# Patient Record
Sex: Female | Born: 2000 | Race: White | Hispanic: No | Marital: Single | Smoking: Never smoker
Health system: Southern US, Community
[De-identification: ages and names within clinical notes are randomized; demographics above are authoritative.]

## PROBLEM LIST (undated history)

## (undated) DIAGNOSIS — K219 Gastro-esophageal reflux disease without esophagitis: Secondary | ICD-10-CM

## (undated) DIAGNOSIS — K5900 Constipation, unspecified: Secondary | ICD-10-CM

## (undated) HISTORY — DX: Gastro-esophageal reflux disease without esophagitis: K21.9

## (undated) HISTORY — PX: EYE SURGERY: SHX253

## (undated) HISTORY — DX: Constipation, unspecified: K59.00

---
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
## 2022-01-13 ENCOUNTER — Ambulatory Visit (INDEPENDENT_AMBULATORY_CARE_PROVIDER_SITE_OTHER): Payer: 59 | Admitting: Physician Assistant

## 2022-01-13 ENCOUNTER — Encounter: Payer: Self-pay | Admitting: Physician Assistant

## 2022-01-13 DIAGNOSIS — G4700 Insomnia, unspecified: Secondary | ICD-10-CM | POA: Diagnosis not present

## 2022-01-13 DIAGNOSIS — F411 Generalized anxiety disorder: Secondary | ICD-10-CM

## 2022-01-13 DIAGNOSIS — F340 Cyclothymic disorder: Secondary | ICD-10-CM | POA: Diagnosis not present

## 2022-01-13 MED ORDER — TEMAZEPAM 22.5 MG PO CAPS: ORAL_CAPSULE | ORAL | 1 refills | Status: DC

## 2022-01-13 MED ORDER — CLORAZEPATE DIPOTASSIUM 3.75 MG PO TABS: 3.7500 mg | ORAL_TABLET | Freq: Two times a day (BID) | ORAL | 1 refills | Status: DC | PRN

## 2022-01-13 MED ORDER — LAMOTRIGINE 150 MG PO TABS: 150.0000 mg | ORAL_TABLET | Freq: Every day | ORAL | 1 refills | Status: DC

## 2022-01-13 NOTE — Progress Notes (Signed)
[facility]  Patient ID: Caroline Bryan,  MRN: [id number]  PCP: [name], MD (Inactive)  Date of Evaluation: 01/13/2022 time spent:20 minutes  Chief Complaint:  Chief Complaint   Anxiety; Depression; Follow-up; Insomnia    We increased the Lamictal and Trileptal 5 weeks ago.  He is doing much better.  Is able to handle stressors better than she did. Patient is able to enjoy things.  Energy and motivation are good.  School is going well.   No extreme sadness, tearfulness, or feelings of hopelessness.  Sleeps well but she does have to take Restoril sometimes. ADLs and personal hygiene are normal.   Denies any changes in concentration, making decisions, or remembering things.  Appetite has not changed.  Weight is stable.  Denies laxative use, calorie restricting, or binging and purging.   Denies cutting or any form of self-harm. Not isolating.  Denies suicidal or homicidal thoughts.  She does have anxiety at times, requiring temazepam.  Does not needed as much as she used to.  It is still effective.  She will occasionally take the hydroxyzine and chooses to do that prior to taking the temazepam.  She is not really having panic attacks at all now it is more of a sense of unease and being overwhelmed.  Patient denies increased energy with decreased need for sleep, increased talkativeness, racing thoughts, impulsivity or risky behaviors, increased spending, increased libido, grandiosity, increased irritability or anger, paranoia, or hallucinations.  Denies dizziness, syncope, seizures, numbness, tingling, tremor, tics, unsteady gait, slurred speech, confusion. Denies muscle or joint pain, stiffness, or dystonia. Denies unexplained weight loss, frequent infections, or sores that heal slowly.  No polyphagia, polydipsia, or polyuria. Denies visual changes or paresthesias.    Individual Medical History/ Review of Systems: Changes? :No   Past medications for mental health diagnoses  include: Wellbutrin, Ambien, Cymbalta caused mania w/ psychosis, Tranxene, Restoril, Lamictal, Trileptal   Allergies: Patient has no known allergies.  Current Medications:  Current Outpatient Medications:    b complex vitamins capsule, Take 1 capsule by mouth daily., Disp: , Rfl:    hydrOXYzine (ATARAX/VISTARIL) 25 MG tablet, TAKE 1 TABLET BY MOUTH 3 TIMES DAILY AS NEEDED FOR ANXIETY OR ITCHING (URTICARIA)., Disp: 270 tablet, Rfl: 1   lamoTRIgine (LAMICTAL) 150 MG tablet, Take 1 tablet (150 mg total) by mouth daily., Disp: 90 tablet, Rfl: 1   oxcarbazepine (TRILEPTAL) 600 MG tablet, TAKE 1 TABLET BY MOUTH TWICE A DAY, Disp: 180 tablet, Rfl: 0   VITAMIN D, ERGOCALCIFEROL, PO, Take by mouth., Disp: , Rfl:    clorazepate (TRANXENE) 3.75 MG tablet, Take 1 tablet (3.75 mg total) by mouth 2 (two) times daily as needed for anxiety (Panic)., Disp: 60 tablet, Rfl: 1   temazepam (RESTORIL) 22.5 MG capsule, TAKE 1 CAPSULE BY MOUTH AT BEDITME AS NEEDED FOR SLEEP OR ANXIETY, Disp: 30 capsule, Rfl: 1 Medication Side Effects: none  Family Medical/ Social History: Changes?  GM died in 05-27-2022. Was expected, she had pancreatic cancer. Her uncle died in 26-Sep-2022 unexpected from OD. Then an old friend from HS died not long after that.   MENTAL HEALTH EXAM:  There were no vitals taken for this visit.There is no height or weight on file to calculate BMI.  General Appearance: Casual and Well Groomed  Eye Contact:  Good  Speech:  Clear and Coherent and Normal Rate  Volume:  Normal  Mood:  Euthymic  Affect:  Appropriate  Thought Process:  Goal Directed and Descriptions of Associations:  Circumstantial  Orientation:  Full (Time, Place, and Person)  Thought Content: Logical   Suicidal Thoughts:  No  Homicidal Thoughts:  No  Memory:  WNL  Judgement:  Good  Insight:  Good  Psychomotor Activity:  Normal  Concentration:  Concentration: Good and Attention Span: Good  Recall:  Good  Fund of Knowledge: Good   Language: Good  Assets:  Desire for Improvement Financial Resources/Insurance Housing Transportation Vocational/Educational  ADL's:  Intact  Cognition: WNL  Prognosis:  Good   DIAGNOSES:    ICD-10-CM   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 Generalized anxiety disorder  F41.1 temazepam (RESTORIL) 22.5 MG capsule    clorazepate (TRANXENE) 3.75 MG tablet    3. Insomnia, unspecified type  G47.00      Receiving Psychotherapy: No   RECOMMENDATIONS:  PDMP was reviewed.  Last temazepam 11/20/2021.  I provided 20 minutes of face to face time during this encounter, including time spent before and after the visit in records review, medical decision making, counseling pertinent to today's visit, and charting.   Glad to see her doing better.  No changes will be made. Sleep hygiene discussed.  Continue Tranxene 3.75 mg, 1 p.o. twice daily as needed panic.  Take as little as possible. Continue hydroxyzine 25 mg, 1 p.o. 3 times daily as needed. Continue Lamictal 150 mg daily. Continue Trileptal 600 mg, 1 p.o. twice daily. Continue Restoril 22.5 mg, 1 p.o. nightly as needed sleep. Recommend B complex and vitamin D daily. Recommend therapy. Return in 4 months.  Melony Overly, PA-C

## 2022-04-03 ENCOUNTER — Other Ambulatory Visit: Payer: Self-pay | Admitting: Physician Assistant

## 2022-04-03 DIAGNOSIS — F411 Generalized anxiety disorder: Secondary | ICD-10-CM

## 2022-04-06 ENCOUNTER — Other Ambulatory Visit: Payer: Self-pay | Admitting: Physician Assistant

## 2022-05-03 ENCOUNTER — Ambulatory Visit (INDEPENDENT_AMBULATORY_CARE_PROVIDER_SITE_OTHER): Payer: Self-pay | Admitting: Physician Assistant

## 2022-05-03 DIAGNOSIS — Z91199 Patient's noncompliance with other medical treatment and regimen due to unspecified reason: Secondary | ICD-10-CM

## 2022-05-03 NOTE — Progress Notes (Signed)
No show for appt. 

## 2022-06-15 ENCOUNTER — Other Ambulatory Visit: Payer: Self-pay | Admitting: Physician Assistant

## 2022-07-11 ENCOUNTER — Other Ambulatory Visit: Payer: Self-pay | Admitting: Physician Assistant

## 2022-07-11 NOTE — Telephone Encounter (Signed)
Please call to schedule an appt, was a no show in March  

## 2022-07-12 NOTE — Telephone Encounter (Signed)
Lvm for patient to call and schedule 

## 2022-07-23 ENCOUNTER — Other Ambulatory Visit: Payer: Self-pay | Admitting: Physician Assistant

## 2022-07-28 ENCOUNTER — Ambulatory Visit (INDEPENDENT_AMBULATORY_CARE_PROVIDER_SITE_OTHER): Payer: 59 | Admitting: Physician Assistant

## 2022-07-28 ENCOUNTER — Encounter: Payer: Self-pay | Admitting: Physician Assistant

## 2022-07-28 DIAGNOSIS — F411 Generalized anxiety disorder: Secondary | ICD-10-CM | POA: Diagnosis not present

## 2022-07-28 DIAGNOSIS — G4700 Insomnia, unspecified: Secondary | ICD-10-CM | POA: Diagnosis not present

## 2022-07-28 DIAGNOSIS — F340 Cyclothymic disorder: Secondary | ICD-10-CM | POA: Diagnosis not present

## 2022-07-28 MED ORDER — OXCARBAZEPINE 600 MG PO TABS: 600.0000 mg | ORAL_TABLET | Freq: Two times a day (BID) | ORAL | 1 refills | Status: DC

## 2022-07-28 MED ORDER — ALPRAZOLAM 0.5 MG PO TABS: 0.2500 mg | ORAL_TABLET | Freq: Two times a day (BID) | ORAL | 0 refills | Status: DC | PRN

## 2022-07-28 MED ORDER — TEMAZEPAM 22.5 MG PO CAPS
22.5000 mg | ORAL_CAPSULE | Freq: Every evening | ORAL | 3 refills | Status: DC | PRN
Start: 2022-07-28 — End: 2022-08-02

## 2022-07-28 MED ORDER — LAMOTRIGINE 150 MG PO TABS: 150.0000 mg | ORAL_TABLET | Freq: Every day | ORAL | 1 refills | Status: DC

## 2022-07-28 NOTE — Progress Notes (Signed)
[facility]  Patient ID: Caroline Bryan,  MRN: [id number]  PCP: Caroline Purl, MD (Inactive)  Date of Evaluation: 07/28/2022 Time spent:30 minutes  Chief Complaint:  Chief Complaint   Anxiety; Depression; Insomnia; Follow-up    Has been under more stress. See Social Hx. Feels that the Tranxene doesn't help like it used to. She still gets 'stress hives' and uses hydroxyzine for that. It is effective for that but makes her drowsy. Starts to get panicky sometimes and the Tranxene used to help but not anymore. Doesn't use it often.   Patient is able to enjoy things.  Energy and motivation are good. Not working right now, plans to when she moves back to [place] later this summer.   No extreme sadness, tearfulness, or feelings of hopelessness.  Sleeps well but sometimes takes the restoril which helps. ADLs and personal hygiene are normal.   Denies any changes in concentration, making decisions, or remembering things.  Appetite has not changed.  Weight is stable.  Denies laxative use, calorie restricting, or binging and purging.   Denies cutting or any form of self-harm.  Denies suicidal or homicidal thoughts.  Patient denies increased energy with decreased need for sleep, increased talkativeness, racing thoughts, impulsivity or risky behaviors, increased spending, increased libido, grandiosity, increased irritability or anger, paranoia, or hallucinations.  Denies dizziness, syncope, seizures, numbness, tingling, tremor, tics, unsteady gait, slurred speech, confusion. Denies muscle or joint pain, stiffness, or dystonia. Denies unexplained weight loss, frequent infections, or sores that heal slowly.  No polyphagia, polydipsia, or polyuria. Denies visual changes or paresthesias.    Individual Medical History/ Review of Systems: Changes? :No   Past medications for mental health diagnoses include: Wellbutrin, Ambien, Cymbalta caused mania w/ psychosis, Tranxene, Restoril, Lamictal, Trileptal    Allergies: Patient has no known allergies.  Current Medications:  Current Outpatient Medications:    ALPRAZolam (XANAX) 0.5 MG tablet, Take 0.5-1 tablets (0.25-0.5 mg total) by mouth 2 (two) times daily as needed for anxiety., Disp: 60 tablet, Rfl: 0   b complex vitamins capsule, Take 1 capsule by mouth daily., Disp: , Rfl:    ferrous sulfate 325 (65 FE) MG EC tablet, Take 325 mg by mouth daily., Disp: , Rfl:    hydrOXYzine (ATARAX/VISTARIL) 25 MG tablet, TAKE 1 TABLET BY MOUTH 3 TIMES DAILY AS NEEDED FOR ANXIETY OR ITCHING (URTICARIA)., Disp: 270 tablet, Rfl: 1   VITAMIN D, ERGOCALCIFEROL, PO, Take by mouth., Disp: , Rfl:    lamoTRIgine (LAMICTAL) 150 MG tablet, Take 1 tablet (150 mg total) by mouth daily., Disp: 90 tablet, Rfl: 1   oxcarbazepine (TRILEPTAL) 600 MG tablet, Take 1 tablet (600 mg total) by mouth 2 (two) times daily., Disp: 180 tablet, Rfl: 1 Medication Side Effects: none  Family Medical/ Social History: Changes?  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 That's been stressful. Had to take Incomplete in some of her classes b/c of the stress.   MENTAL HEALTH EXAM:  There were no vitals taken for this visit.There is no height or weight on file to calculate BMI.  General Appearance: Casual and Well Groomed  Eye Contact:  Good  Speech:  Clear and Coherent and Normal Rate  Volume:  Normal  Mood:  Euthymic  Affect:  Appropriate  Thought Process:  Goal Directed and Descriptions of Associations: Circumstantial  Orientation:  Full (Time, Place, and Person)  Thought Content: Logical   Suicidal Thoughts:  No  Homicidal Thoughts:  No  Memory:  WNL  Judgement:  Good  Insight:  Good  Psychomotor Activity:  Normal  Concentration:  Concentration: Good and Attention Span: Good  Recall:  Good  Fund of Knowledge: Good  Language: Good  Assets:  Communication Skills Desire for Improvement Financial Resources/Insurance Housing Transportation Vocational/Educational  ADL's:  Intact   Cognition: WNL  Prognosis:  Good   DIAGNOSES:    ICD-10-CM   1. 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 Insomnia, unspecified type  G47.00      Receiving Psychotherapy: No   RECOMMENDATIONS:  PDMP was reviewed.  Last temazepam 06/14/2022. I provided 30 minutes of face to face time during this encounter, including time spent before and after the visit in records review, medical decision making, counseling pertinent to today's visit, and charting.   We discussed the benzodiazepine use.  I would like to get her completely off due to the addictive potential.  However she has been on them for several years off and on, does not take very often, and hydroxyzine causes too much drowsiness to take that for rescue when she gets super anxious.  For now we will continue their use but we will change the Tranxene to Xanax. We discussed the short-term and long-term risks of benzodiazepines including sedation, increased risk of falling, dizziness, tolerance, and addictive potential.  Patient understands and accepts.  Sleep hygiene discussed at length.  Discontinue Tranxene. Start Xanax 0.5 mg, 1/2-1 twice daily as needed anxiety.  Take as little as possible. Continue hydroxyzine 25 mg, 1 p.o. 3 times daily as needed. Continue Lamictal 150 mg daily. Continue Trileptal 600 mg, 1 p.o. twice daily. Continue Restoril 22.5 mg, 1 p.o. nightly as needed sleep. Take as sparingly as possible.  Recommend B complex and vitamin D daily. Recommend therapy. Return in 6 months.  Melony Overly, PA-C

## 2023-01-16 ENCOUNTER — Other Ambulatory Visit: Payer: Self-pay | Admitting: Physician Assistant

## 2023-01-16 DIAGNOSIS — F411 Generalized anxiety disorder: Secondary | ICD-10-CM

## 2023-01-19 ENCOUNTER — Other Ambulatory Visit: Payer: Self-pay | Admitting: Physician Assistant

## 2023-01-19 DIAGNOSIS — F411 Generalized anxiety disorder: Secondary | ICD-10-CM

## 2023-01-24 ENCOUNTER — Other Ambulatory Visit: Payer: Self-pay | Admitting: Physician Assistant

## 2023-01-24 DIAGNOSIS — F411 Generalized anxiety disorder: Secondary | ICD-10-CM

## 2023-01-25 NOTE — Telephone Encounter (Signed)
LF 10/9  6/5 note:  Continue Restoril 22.5 mg, 1 p.o. nightly as needed sleep. Take as sparingly as possible.  (Note on pharmacy RF said it was discontinued)  Has been filling. Pending for your review.   12/01/2022 07/28/2022 1  Temazepam 22.5 Mg Capsule 30.00 30 [place] [id number] [place] (0391) 3/3 1.13 [facility] Comm [facility] [place] 10/20/2022 07/28/2022 1  Temazepam 22.5 Mg Capsule 30.00 30 [place] [id number] Nor (0391) 2/3 1.13 L[facility] Comm Ins [place] 09/13/2022 07/28/2022 1  Temazepam 22.5 Mg Capsule 30.00 30 [place] [id number] Nor (0391) 1/3 1.13 LME Comm Ins [place] 07/28/2022 07/28/2022 1  Alprazolam 0.5 Mg Tablet 60.00 30 [place] [id number] [place] (0391) 0/0 2.00 LME Comm Ins [place] 07/28/2022 07/28/2022 1  Temazepam 22.5 Mg Capsule 30.00 30

## 2023-02-09 ENCOUNTER — Ambulatory Visit (INDEPENDENT_AMBULATORY_CARE_PROVIDER_SITE_OTHER): Payer: 59 | Admitting: Physician Assistant

## 2023-02-09 ENCOUNTER — Encounter: Payer: Self-pay | Admitting: Physician Assistant

## 2023-02-09 DIAGNOSIS — Z79899 Other long term (current) drug therapy: Secondary | ICD-10-CM

## 2023-02-09 DIAGNOSIS — F319 Bipolar disorder, unspecified: Secondary | ICD-10-CM

## 2023-02-09 DIAGNOSIS — F411 Generalized anxiety disorder: Secondary | ICD-10-CM | POA: Diagnosis not present

## 2023-02-09 DIAGNOSIS — L282 Other prurigo: Secondary | ICD-10-CM | POA: Diagnosis not present

## 2023-02-09 MED ORDER — ALPRAZOLAM 0.5 MG PO TABS: 0.2500 mg | ORAL_TABLET | Freq: Two times a day (BID) | ORAL | 0 refills | Status: DC | PRN

## 2023-02-09 MED ORDER — TEMAZEPAM 22.5 MG PO CAPS: 22.5000 mg | ORAL_CAPSULE | Freq: Every evening | ORAL | 5 refills | Status: DC | PRN

## 2023-02-09 MED ORDER — HYDROXYZINE HCL 25 MG PO TABS: 25.0000 mg | ORAL_TABLET | Freq: Three times a day (TID) | ORAL | 1 refills | Status: DC | PRN

## 2023-02-09 MED ORDER — LAMOTRIGINE 150 MG PO TABS: 150.0000 mg | ORAL_TABLET | Freq: Every day | ORAL | 1 refills | Status: DC

## 2023-02-09 MED ORDER — OXCARBAZEPINE 600 MG PO TABS: 600.0000 mg | ORAL_TABLET | Freq: Three times a day (TID) | ORAL | 1 refills | Status: DC

## 2023-02-09 NOTE — Progress Notes (Signed)
[facility]  Patient ID: Caroline Bryan,  MRN: [id number]  PCP: [name], MD (Inactive)  Date of Evaluation: 02/09/2023 Time spent:35 minutes  Chief Complaint:  Chief Complaint   Anxiety; Depression; Insomnia; Follow-up    Has been under a lot of stress.  At the beginning of the semester, she was really anxious, unable to eat much, fainted, had to go to the ER. Better with that now. Has felt 'manicky' for a few months. Spends more, had to withdraw from a few college classes because she didn't have the 'mental capacity to do anything,' not sleeping well but wasn't able to get Restoril, which she must have or she can't sleep. Had a friend keep her credit/debit cards b/c she was spending impulsively, no concept of consequences for awhile, 'wreckless' sexual behaviors, paranoia in that she checks things over and over to make sure she locked the doors for example. Also more irritable, gets angry easily.  Patient denies increased talkativeness, racing thoughts, grandiosity, or hallucinations.  Patient is able to enjoy things.  Energy and motivation are good.  Work is going well.  She works 2 jobs, lives in [place] where she goes to college.  No extreme sadness, tearfulness, or feelings of hopelessness.  ADLs and personal hygiene are normal.   Denies any changes in concentration, making decisions, or remembering things.  Appetite has not changed.  Weight is stable.  Denies laxative use, calorie restricting, or binging and purging.   Denies cutting or any form of self-harm.  Anxiety is controlled, she rarely takes Xanax.  Denies suicidal or homicidal thoughts.  Denies dizziness, syncope, seizures, numbness, tingling, tremor, tics, unsteady gait, slurred speech, confusion. Denies muscle or joint pain, stiffness, or dystonia. Denies unexplained weight loss, frequent infections, or sores that heal slowly.  No polyphagia, polydipsia, or polyuria. Denies visual changes or paresthesias.     Individual Medical History/ Review of Systems: Changes? :No   Past medications for mental health diagnoses include: Wellbutrin, Ambien, Cymbalta caused mania w/ psychosis, Tranxene, Restoril, Lamictal, Trileptal   Allergies: Patient has no known allergies.  Current Medications:  Current Outpatient Medications:    b complex vitamins capsule, Take 1 capsule by mouth daily., Disp: , Rfl:    ferrous sulfate 325 (65 FE) MG EC tablet, Take 325 mg by mouth daily., Disp: , Rfl:    VITAMIN D, ERGOCALCIFEROL, PO, Take by mouth., Disp: , Rfl:    ALPRAZolam (XANAX) 0.5 MG tablet, Take 0.5-1 tablets (0.25-0.5 mg total) by mouth 2 (two) times daily as needed for anxiety., Disp: 60 tablet, Rfl: 0   hydrOXYzine (ATARAX) 25 MG tablet, Take 1 tablet (25 mg total) by mouth every 8 (eight) hours as needed., Disp: 270 tablet, Rfl: 1   lamoTRIgine (LAMICTAL) 150 MG tablet, Take 1 tablet (150 mg total) by mouth daily., Disp: 90 tablet, Rfl: 1   oxcarbazepine (TRILEPTAL) 600 MG tablet, Take 1 tablet (600 mg total) by mouth 3 (three) times daily., Disp: 270 tablet, Rfl: 1   temazepam (RESTORIL) 22.5 MG capsule, Take 1 capsule (22.5 mg total) by mouth at bedtime as needed., Disp: 30 capsule, Rfl: 5 Medication Side Effects: none  Family Medical/ Social History: Changes?  no  MENTAL HEALTH EXAM:  There were no vitals taken for this visit.There is no height or weight on file to calculate BMI.  General Appearance: Casual and Well Groomed  Eye Contact:  Good  Speech:  Clear and Coherent and Normal Rate  Volume:  Normal  Mood:  Anxious  Affect:  Appropriate  Thought Process:  Goal Directed and Descriptions of Associations: Circumstantial  Orientation:  Full (Time, Place, and Person)  Thought Content: Logical   Suicidal Thoughts:  No  Homicidal Thoughts:  No  Memory:  WNL  Judgement:  Good  Insight:  Good  Psychomotor Activity:  Normal  Concentration:  Concentration: Good and Attention Span: Good  Recall:   Good  Fund of Knowledge: Good  Language: Good  Assets:  Communication Skills Desire for Improvement Financial Resources/Insurance Housing Transportation Vocational/Educational  ADL's:  Intact  Cognition: WNL  Prognosis:  Good   DIAGNOSES:    ICD-10-CM   1. Encounter for long-term (current) use of medications  Z79.899 Lamotrigine level    10-Hydroxycarbazepine    10-Hydroxycarbazepine    Lamotrigine level    2. Generalized anxiety disorder  F41.1 hydrOXYzine (ATARAX) 25 MG tablet    3. 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 Bipolar I disorder (HCC)  F31.9 Lamotrigine level    10-Hydroxycarbazepine    10-Hydroxycarbazepine    Lamotrigine level      Receiving Psychotherapy: No   RECOMMENDATIONS:  PDMP was reviewed.  Last temazepam 12/01/2022.   I provided 35 minutes of face to face time during this encounter, including time spent before and after the visit in records review, medical decision making, counseling pertinent to today's visit, and charting.   Discussed the mania. She's been stable on the Trileptal for awhile, I recommend increasing the dose. Benefits, risks, SE discussed and she accepts.   Continue Xanax 0.5 mg, 1/2-1 twice daily as needed anxiety.  Take as little as possible. Continue hydroxyzine 25 mg, 1 p.o. 3 times daily as needed. Continue Lamictal 150 mg daily. Increase to Trileptal 600 m g, 1 p.o. tid. Continue Restoril 22.5 mg, 1 p.o. nightly as needed sleep. Take as sparingly as possible.  Continue B complex and vitamin D daily. Labs ordered as above. Recommend therapy. Return in 6 weeks.  Melony Overly, PA-C

## 2023-02-10 ENCOUNTER — Telehealth: Payer: Self-pay | Admitting: Physician Assistant

## 2023-02-10 NOTE — Telephone Encounter (Signed)
Pt was seen yesterday and needs a PA for her temazepam 22.5 She would like a call back when it is approved [phone]

## 2023-02-26 LAB — LAMOTRIGINE LEVEL: Lamotrigine Lvl: 2.5 ug/mL (ref 2.0–20.0)

## 2023-02-26 LAB — 10-HYDROXYCARBAZEPINE: Oxcarbazepine SerPl-Mcnc: 29 ug/mL (ref 10–35)

## 2023-03-01 ENCOUNTER — Telehealth: Payer: Self-pay

## 2023-03-01 NOTE — Telephone Encounter (Signed)
 Prior Authorization initiated for Temazepam 22.5 mg with [facility],

## 2023-03-01 NOTE — Telephone Encounter (Signed)
 See phone message with approval

## 2023-03-01 NOTE — Telephone Encounter (Signed)
 Prior Approval received effective 01/30/2023-02/29/2024, PA# [id number] for Temazepam 22.5 mg with [facility]

## 2023-03-10 NOTE — Progress Notes (Signed)
Left VM on mobile to RC.

## 2023-03-29 ENCOUNTER — Other Ambulatory Visit: Payer: Self-pay | Admitting: Physician Assistant

## 2023-03-30 ENCOUNTER — Telehealth (INDEPENDENT_AMBULATORY_CARE_PROVIDER_SITE_OTHER): Payer: 59 | Admitting: Physician Assistant

## 2023-03-30 ENCOUNTER — Encounter: Payer: Self-pay | Admitting: Physician Assistant

## 2023-03-30 DIAGNOSIS — F411 Generalized anxiety disorder: Secondary | ICD-10-CM | POA: Diagnosis not present

## 2023-03-30 DIAGNOSIS — F319 Bipolar disorder, unspecified: Secondary | ICD-10-CM | POA: Diagnosis not present

## 2023-03-30 DIAGNOSIS — G4700 Insomnia, unspecified: Secondary | ICD-10-CM

## 2023-03-30 NOTE — Progress Notes (Signed)
 [facility]  Patient ID: Caroline Bryan,  MRN: [id number]  PCP: Hosey Dunnings, MD (Inactive)  Date of Evaluation: 03/30/2023 Time spent:20 minutes Virtual Visit via Telehealth  I connected with patient by a video enabled telemedicine application with their informed consent, and verified patient privacy and that I am speaking with the correct person using two identifiers.  I am private, in my office and the patient is at home.  I discussed the limitations, risks, security and privacy concerns of performing an evaluation and management service by video and the availability of in person appointments. I also discussed with the patient that there may be a patient responsible charge related to this service. The patient expressed understanding and agreed to proceed.   I discussed the assessment and treatment plan with the patient. The patient was provided an opportunity to ask questions and all were answered. The patient agreed with the plan and demonstrated an understanding of the instructions.   The patient was advised to call back or seek an in-person evaluation if the symptoms worsen or if the condition fails to improve as anticipated.  I provided 20  minutes of non-face-to-face time during this encounter.  Chief Complaint:  Chief Complaint   Anxiety; Depression; Follow-up    States she is doing well as far as her mental health is concerned.  She has the flu right now.  She is not feeling well from that but otherwise she is okay.  Patient is able to enjoy things.  Energy and motivation are good.  She is very busy at school.  It is going well, grades are good.  No extreme sadness, tearfulness, or feelings of hopelessness.  Sleeps well most of the time.  She does require Restoril  a lot of the time.  If she does not take it she has trouble falling asleep and staying asleep.  ADLs and personal hygiene are normal.  States that attention is good without easy distractibility.  Able to  focus on things and finish tasks to completion.  Appetite is normal and weight is stable.  No calorie restricting, laxative use, or binging or purging.  Anxiety is controlled, she rarely takes the Xanax .  Denies suicidal or homicidal thoughts.  Patient denies increased energy with decreased need for sleep, increased talkativeness, racing thoughts, impulsivity or risky behaviors, increased spending, increased libido, grandiosity, increased irritability or anger, paranoia, or hallucinations.  Denies dizziness, syncope, seizures, numbness, tingling, tremor, tics, unsteady gait, slurred speech, confusion. Denies muscle or joint pain, stiffness, or dystonia. Denies unexplained weight loss, frequent infections, or sores that heal slowly.  No polyphagia, polydipsia, or polyuria. Denies visual changes or paresthesias.    Individual Medical History/ Review of Systems: Changes? :Yes  has the flu  Past medications for mental health diagnoses include: Wellbutrin , Ambien , Cymbalta  caused mania w/ psychosis, Tranxene , Restoril , Lamictal , Trileptal    Allergies: Patient has no known allergies.  Current Medications:  Current Outpatient Medications:    ALPRAZolam  (XANAX ) 0.5 MG tablet, Take 0.5-1 tablets (0.25-0.5 mg total) by mouth 2 (two) times daily as needed for anxiety., Disp: 60 tablet, Rfl: 0   b complex vitamins capsule, Take 1 capsule by mouth daily., Disp: , Rfl:    ferrous sulfate 325 (65 FE) MG EC tablet, Take 325 mg by mouth daily., Disp: , Rfl:    hydrOXYzine  (ATARAX ) 25 MG tablet, Take 1 tablet (25 mg total) by mouth every 8 (eight) hours as needed., Disp: 270 tablet, Rfl: 1   lamoTRIgine  (LAMICTAL ) 150 MG tablet,  Take 1 tablet (150 mg total) by mouth daily., Disp: 90 tablet, Rfl: 1   temazepam  (RESTORIL ) 22.5 MG capsule, Take 1 capsule (22.5 mg total) by mouth at bedtime as needed., Disp: 30 capsule, Rfl: 5   VITAMIN D, ERGOCALCIFEROL, PO, Take by mouth., Disp: , Rfl:    oxcarbazepine  (TRILEPTAL )  600 MG tablet, Take 1 tablet (600 mg total) by mouth 3 (three) times daily., Disp: 270 tablet, Rfl: 1 Medication Side Effects: none  Family Medical/ Social History: Changes?  no  MENTAL HEALTH EXAM:  There were no vitals taken for this visit.There is no height or weight on file to calculate BMI.  General Appearance: Casual and Well Groomed  Eye Contact:  Good  Speech:  Clear and Coherent and Normal Rate  Volume:  Normal  Mood:  Euthymic  Affect:  Appropriate  Thought Process:  Goal Directed and Descriptions of Associations: Circumstantial  Orientation:  Full (Time, Place, and Person)  Thought Content: Logical   Suicidal Thoughts:  No  Homicidal Thoughts:  No  Memory:  WNL  Judgement:  Good  Insight:  Good  Psychomotor Activity:  Normal  Concentration:  Concentration: Good and Attention Span: Good  Recall:  Good  Fund of Knowledge: Good  Language: Good  Assets:  Communication Skills Desire for Improvement Financial Resources/Insurance Housing Transportation Vocational/Educational  ADL's:  Intact  Cognition: WNL  Prognosis:  Good   DIAGNOSES:    ICD-10-CM   1. Bipolar I disorder (HCC)  F31.9     2. Generalized anxiety disorder  F41.1     3. Insomnia, unspecified type  G47.00       Receiving Psychotherapy: No   RECOMMENDATIONS:  PDMP was reviewed.  Last temazepam  02/24/2023.  Xanax  filled 02/09/2024. I provided 20 minutes of non-face-to-face time during this encounter, including time spent before and after the visit in records review, medical decision making, counseling pertinent to today's visit, and charting.   She is doing well as far as her mental health goes so no changes will be made.  I hope she recovers from the flu soon.  Continue Xanax  0.5 mg, 1/2-1 twice daily as needed anxiety.  Take as little as possible. Continue hydroxyzine  25 mg, 1 p.o. 3 times daily as needed. Continue Lamictal  150 mg daily. Continue Trileptal  600 m g, 1 p.o. tid. Continue  Restoril  22.5 mg, 1 p.o. nightly as needed sleep. Take as sparingly as possible.  Continue B complex and vitamin D daily. Recommend therapy. Return in 3 months.  Verneita Cooks, PA-C

## 2023-03-30 NOTE — Telephone Encounter (Signed)
HAS APPT TODAY

## 2023-06-10 ENCOUNTER — Other Ambulatory Visit: Payer: Self-pay | Admitting: Physician Assistant

## 2023-08-07 ENCOUNTER — Other Ambulatory Visit: Payer: Self-pay | Admitting: Physician Assistant

## 2023-08-07 DIAGNOSIS — L282 Other prurigo: Secondary | ICD-10-CM

## 2023-08-07 DIAGNOSIS — F411 Generalized anxiety disorder: Secondary | ICD-10-CM

## 2023-08-11 ENCOUNTER — Other Ambulatory Visit: Payer: Self-pay | Admitting: Physician Assistant

## 2023-08-29 ENCOUNTER — Ambulatory Visit (INDEPENDENT_AMBULATORY_CARE_PROVIDER_SITE_OTHER): Admitting: Physician Assistant

## 2023-08-29 ENCOUNTER — Encounter: Payer: Self-pay | Admitting: Physician Assistant

## 2023-08-29 DIAGNOSIS — G4700 Insomnia, unspecified: Secondary | ICD-10-CM | POA: Diagnosis not present

## 2023-08-29 DIAGNOSIS — F411 Generalized anxiety disorder: Secondary | ICD-10-CM | POA: Diagnosis not present

## 2023-08-29 DIAGNOSIS — F319 Bipolar disorder, unspecified: Secondary | ICD-10-CM

## 2023-08-29 MED ORDER — ALPRAZOLAM 0.5 MG PO TABS: 0.5000 mg | ORAL_TABLET | Freq: Two times a day (BID) | ORAL | 5 refills | Status: DC | PRN

## 2023-08-29 MED ORDER — OXCARBAZEPINE 600 MG PO TABS: 600.0000 mg | ORAL_TABLET | Freq: Three times a day (TID) | ORAL | 1 refills | Status: DC

## 2023-08-29 MED ORDER — TEMAZEPAM 22.5 MG PO CAPS: 22.5000 mg | ORAL_CAPSULE | Freq: Every evening | ORAL | 5 refills | Status: DC | PRN

## 2023-08-29 NOTE — Progress Notes (Signed)
 [facility]  Patient ID: Caroline Bryan,  MRN: [id number]  PCP: Hosey Dunnings, MD (Inactive)  Date of Evaluation: 08/29/2023 Time spent:20 minutes  Chief Complaint:  Chief Complaint   Anxiety; Depression; Follow-up    Doing well w/ current meds. Has some circumstantial stressors, working 2 jobs, also in school. But handling things ok. Patient is able to enjoy things.  Energy and motivation are good.  No extreme sadness, tearfulness, or feelings of hopelessness.  Sleeps well most of the time. ADLs and personal hygiene are normal.   Denies any changes in concentration, making decisions, or remembering things.  Appetite has not changed.  Weight is stable.  Denies laxative use, calorie restricting, or binging and purging.   Denies cutting or any form of self-harm.  No PA but gets overwhelmed sometimes.  Xanax  is helpful.  Doesn't take often. No mania, psychosis, or delirium.  Denies suicidal or homicidal thoughts.  Denies dizziness, syncope, seizures, numbness, tingling, tremor, tics, unsteady gait, slurred speech, confusion. Denies muscle or joint pain, stiffness, or dystonia.  Individual Medical History/ Review of Systems: Changes? :No    Past medications for mental health diagnoses include: Wellbutrin , Ambien , Cymbalta  caused mania w/ psychosis, Tranxene , Restoril , Lamictal , Trileptal    Allergies: Patient has no known allergies.  Current Medications:  Current Outpatient Medications:    b complex vitamins capsule, Take 1 capsule by mouth daily., Disp: , Rfl:    ferrous sulfate 325 (65 FE) MG EC tablet, Take 325 mg by mouth daily., Disp: , Rfl:    hydrOXYzine  (ATARAX ) 25 MG tablet, TAKE 1 TABLET BY MOUTH EVERY 8 HOURS AS NEEDED., Disp: 270 tablet, Rfl: 1   lamoTRIgine  (LAMICTAL ) 150 MG tablet, TAKE 1 TABLET BY MOUTH EVERY DAY, Disp: 90 tablet, Rfl: 1   VITAMIN D, ERGOCALCIFEROL, PO, Take by mouth., Disp: , Rfl:    ALPRAZolam  (XANAX ) 0.5 MG tablet, Take 1 tablet (0.5 mg total)  by mouth 2 (two) times daily as needed for anxiety., Disp: 60 tablet, Rfl: 5   oxcarbazepine  (TRILEPTAL ) 600 MG tablet, Take 1 tablet (600 mg total) by mouth 3 (three) times daily., Disp: 270 tablet, Rfl: 1   temazepam  (RESTORIL ) 22.5 MG capsule, Take 1 capsule (22.5 mg total) by mouth at bedtime as needed., Disp: 30 capsule, Rfl: 5 Medication Side Effects: none  Family Medical/ Social History: Changes?  no  MENTAL HEALTH EXAM:  There were no vitals taken for this visit.There is no height or weight on file to calculate BMI.  General Appearance: Casual and Well Groomed  Eye Contact:  Good  Speech:  Clear and Coherent and Normal Rate  Volume:  Normal  Mood:  Euthymic  Affect:  Appropriate  Thought Process:  Goal Directed and Descriptions of Associations: Circumstantial  Orientation:  Full (Time, Place, and Person)  Thought Content: Logical   Suicidal Thoughts:  No  Homicidal Thoughts:  No  Memory:  WNL  Judgement:  Good  Insight:  Good  Psychomotor Activity:  Normal  Concentration:  Concentration: Good and Attention Span: Good  Recall:  Good  Fund of Knowledge: Good  Language: Good  Assets:  Communication Skills Desire for Improvement Financial Resources/Insurance Housing Physical Health Transportation Vocational/Educational  ADL's:  Intact  Cognition: WNL  Prognosis:  Good   Labs 05/29/2023 reviewed  DIAGNOSES:    ICD-10-CM   1. Bipolar I disorder (HCC)  F31.9     2. Generalized anxiety disorder  F41.1     3. Insomnia, unspecified type  G47.00  Receiving Psychotherapy: No   RECOMMENDATIONS:  PDMP was reviewed.  Last temazepam  08/13/2023.  Xanax  filled 06/13/2023. I provided approximately  20  minutes of face to face time during this encounter, including time spent before and after the visit in records review, medical decision making, counseling pertinent to today's visit, and charting.   Continue Xanax  0.5 mg, 1/2-1 twice daily as needed anxiety.  Take as  little as possible. Continue hydroxyzine  25 mg, 1 p.o. 3 times daily as needed. Continue Lamictal  150 mg daily. Continue Trileptal  600 m g, 1 p.o. tid. Continue Restoril  22.5 mg, 1 p.o. nightly as needed sleep. Take as sparingly as possible.  Continue B complex and vitamin D daily. Recommend therapy. Return in 5 months when home for the holidays.  Verneita Cooks, PA-C

## 2023-10-26 ENCOUNTER — Telehealth: Payer: Self-pay | Admitting: Physician Assistant

## 2023-10-27 NOTE — Telephone Encounter (Addendum)
 There is no message and I don't know what is needed. I don't see that any RF are needed. LVM to [facility].

## 2023-11-01 ENCOUNTER — Telehealth (INDEPENDENT_AMBULATORY_CARE_PROVIDER_SITE_OTHER): Admitting: Physician Assistant

## 2023-11-01 ENCOUNTER — Encounter: Payer: Self-pay | Admitting: Physician Assistant

## 2023-11-01 DIAGNOSIS — F319 Bipolar disorder, unspecified: Secondary | ICD-10-CM

## 2023-11-01 DIAGNOSIS — F481 Depersonalization-derealization syndrome: Secondary | ICD-10-CM | POA: Diagnosis not present

## 2023-11-01 MED ORDER — LURASIDONE HCL 40 MG PO TABS: 40.0000 mg | ORAL_TABLET | Freq: Every day | ORAL | 1 refills | Status: DC
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
## 2023-11-24 ENCOUNTER — Other Ambulatory Visit: Payer: Self-pay | Admitting: Physician Assistant

## 2023-12-27 ENCOUNTER — Other Ambulatory Visit: Payer: Self-pay | Admitting: Physician Assistant

## 2024-02-07 ENCOUNTER — Telehealth: Payer: Self-pay | Admitting: Physician Assistant

## 2024-02-07 ENCOUNTER — Other Ambulatory Visit: Payer: Self-pay

## 2024-02-07 NOTE — Telephone Encounter (Signed)
 Called pharmacy and pharmacist said Xanax  and temazepam  are being flagged due to patient's young age and 2 Bz. I told pharmacist she had been on temazepam  a long time and had been on Xanax  for over a year. He asks for justification for patient to be on both meds.

## 2024-02-07 NOTE — Telephone Encounter (Signed)
 Pt was told by pharmacy there is a drug interaction between temazepam  and xanax  and the pharmacy will not release meds until someone from the office calls to release them.

## 2024-02-08 ENCOUNTER — Other Ambulatory Visit: Payer: Self-pay

## 2024-02-08 DIAGNOSIS — F411 Generalized anxiety disorder: Secondary | ICD-10-CM

## 2024-02-08 DIAGNOSIS — G4700 Insomnia, unspecified: Secondary | ICD-10-CM

## 2024-02-08 MED ORDER — ALPRAZOLAM 0.5 MG PO TABS: 0.5000 mg | ORAL_TABLET | Freq: Two times a day (BID) | ORAL | 0 refills | Status: DC | PRN

## 2024-02-08 NOTE — Telephone Encounter (Signed)
 I'm documenting for the record-I spoke w/ pharmacist. They will not fill the Temazepam , I asked about giving her 10 pills until I can get pt in to discuss. She said I'd have to talk w/ financial planner who will be there on 02/09/2024 from 9-7. I will send Xanax  now.

## 2024-02-08 NOTE — Telephone Encounter (Signed)
 She takes the Xanax  for anxiety as little as possible and never close to bedtime and the Temazepam  for sleep when needed.  I understand these are both BZ, but she's been on them both for years without problems and although not ideal  I recommend she continue. I have no concerns of abuse or diversion.

## 2024-02-09 ENCOUNTER — Other Ambulatory Visit: Payer: Self-pay | Admitting: Physician Assistant

## 2024-02-09 MED ORDER — TEMAZEPAM 22.5 MG PO CAPS: 22.5000 mg | ORAL_CAPSULE | Freq: Every evening | ORAL | 5 refills | Status: AC | PRN

## 2024-02-09 NOTE — Telephone Encounter (Signed)
 Called dad to let him know Rx ready, put in an envelope and put up front.

## 2024-02-09 NOTE — Telephone Encounter (Signed)
 I've handwritten Rx since I'm unable to print from my computer.  Will give to you.

## 2024-02-09 NOTE — Progress Notes (Signed)
 I'm unable to print from my computer. Rx was handwritten for her dad to pick up.

## 2024-02-13 ENCOUNTER — Encounter: Payer: Self-pay | Admitting: Physician Assistant

## 2024-02-13 ENCOUNTER — Ambulatory Visit (INDEPENDENT_AMBULATORY_CARE_PROVIDER_SITE_OTHER): Admitting: Physician Assistant

## 2024-02-13 DIAGNOSIS — F411 Generalized anxiety disorder: Secondary | ICD-10-CM

## 2024-02-13 DIAGNOSIS — L282 Other prurigo: Secondary | ICD-10-CM | POA: Diagnosis not present

## 2024-02-13 DIAGNOSIS — G4700 Insomnia, unspecified: Secondary | ICD-10-CM | POA: Diagnosis not present

## 2024-02-13 MED ORDER — ALPRAZOLAM 0.5 MG PO TABS: 0.5000 mg | ORAL_TABLET | Freq: Two times a day (BID) | ORAL | 0 refills | Status: AC | PRN

## 2024-02-13 MED ORDER — LAMOTRIGINE 150 MG PO TABS: 150.0000 mg | ORAL_TABLET | Freq: Every day | ORAL | 1 refills | Status: AC

## 2024-02-13 MED ORDER — ZOLPIDEM TARTRATE 10 MG PO TABS: 10.0000 mg | ORAL_TABLET | Freq: Every evening | ORAL | 0 refills | Status: AC | PRN

## 2024-02-13 MED ORDER — HYDROXYZINE HCL 25 MG PO TABS: 25.0000 mg | ORAL_TABLET | Freq: Three times a day (TID) | ORAL | 1 refills | Status: AC | PRN

## 2024-02-13 MED ORDER — OXCARBAZEPINE 600 MG PO TABS: 600.0000 mg | ORAL_TABLET | Freq: Two times a day (BID) | ORAL | 1 refills | Status: AC

## 2024-02-13 NOTE — Progress Notes (Addendum)
 "          [facility]  Patient ID: Caroline Bryan,  MRN: [id number]  PCP: Hosey Dunnings, MD (Inactive)  Date of Evaluation: 02/13/2024 Time spent:20 minutes  Chief Complaint:  Chief Complaint   Anxiety; Depression; Insomnia; Follow-up    HISTORY/CURRENT STATUS: HPI For routine med check.   Xanax  only avg 1 per week. Gets panicky sometimes and it is very helpful when needed.  For the most part her other medications help to prevent the anxiety.  Needs the Temazepam  often.  Was out for several months. [facility] refused to dispense the temazepam  and alprazolam .  She has been on that combination of medications since long before I met her approximately 3 to 4 years ago.  Her pharmacy never had a problem with it before.  Has been miserable.  She has taken Ambien  in the past and wonders if they would have such a problem filling that. Without a sleep aid, she only gets a few hours of sleep.    She is able to enjoy things.  She is glad to be home for Christmas break.  School is going well.  Energy and motivation are good.  No extreme sadness, tearfulness, or feelings of hopelessness.  ADLs and personal hygiene are normal.   Denies any changes in concentration, making decisions, or remembering things.  Appetite has not changed.  Weight is stable.  Denies laxative use, calorie restricting, or binging and purging.   Denies cutting or any form of self-harm.  No mania, delirium, AH/VH.  No SI/HI.  Individual Medical History/ Review of Systems: Changes? :No   Past medications for mental health diagnoses include: Wellbutrin , Ambien , Cymbalta  caused mania w/ psychosis, Tranxene , Restoril , Lamictal , Trileptal   Allergies: Patient has no known allergies.  Current Medications: Current Medications[1] Medication Side Effects: none  Family Medical/ Social History: Changes? No  MENTAL HEALTH EXAM:  There were no vitals taken for this visit.There is no height or weight on file to calculate BMI.  General  Appearance: Casual and Well Groomed  Eye Contact:  Good  Speech:  Clear and Coherent and Normal Rate  Volume:  Normal  Mood:  Euthymic  Affect:  Congruent  Thought Process:  Goal Directed and Descriptions of Associations: Circumstantial  Orientation:  Full (Time, Place, and Person)  Thought Content: Logical   Suicidal Thoughts:  No  Homicidal Thoughts:  No  Memory:  WNL  Judgement:  Good  Insight:  Good  Psychomotor Activity:  Normal  Concentration:  Concentration: Good  Recall:  Good  Fund of Knowledge: Good  Language: Good  Assets:  Communication Skills Desire for Improvement Financial Resources/Insurance Housing Resilience Social Support Transportation Vocational/Educational  ADL's:  Intact  Cognition: WNL  Prognosis:  Good   DIAGNOSES:    ICD-10-CM   1. Generalized anxiety disorder  F41.1 ALPRAZolam  (XANAX ) 0.5 MG tablet    hydrOXYzine  (ATARAX ) 25 MG tablet    2. Insomnia, unspecified type  G47.00     3. 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 Restoril  filled 02/09/2024.  Xanax  filled 10/05/2023. I provided approximately 20 minutes of face to face time during this encounter, including time spent before and after the visit in records review, medical decision making, counseling pertinent to today's visit, and charting.   We discussed the fact that she is on 2 benzodiazepines.  I wish she did not have to take both  and she does too.  But she has been on them for years, she was on it when I started seeing her after her child and adolescent psychiatrist retired.  I have continued the same medications because they seem to work and there has been no evidence of abuse or diversion.  She does not take the Xanax  often and the [place]  registry shows she is not even getting it filled as often as is possible. She would like to change Restoril  to Ambien  if appropriate when due next  time, to see if that will be an issue in dispensing.  She's aware it's still a sedative so the pharmacist may not fill it, but we'll try it. Will need to consider Trazodone or Mirtazapine or something else for sleep or anxiety in the future but this isn't a good time (winter, holidays) to make a big change. She agrees.   Continue Xanax  0.5 mg, 1 p.o. twice daily as needed anxiety.  Prescription hand written at patient's request, I am unable to print from my computer. Continue hydroxyzine  25 mg, 1 p.o. 3 times daily as needed. Continue Lamictal  150 mg, 1 p.o. daily. Continue Latuda  40 mg, 1 p.o. daily with food. Continue Trileptal  300 mg, 1 p.o. twice daily. Continue temazepam  22.5 mg, 1 p.o. nightly as needed sleep. Start Ambien  10 mg, 1 p.o. nightly as needed sleep.  (To begin after she runs out of the temazepam .) prescription hand written at patient's request.  I am unable to print from my computer. Return in 3 months.  Verneita Cooks, PA-C      [1]  Current Outpatient Medications:    b complex vitamins capsule, Take 1 capsule by mouth daily., Disp: , Rfl:    ferrous sulfate 325 (65 FE) MG EC tablet, Take 325 mg by mouth daily., Disp: , Rfl:    lurasidone  (LATUDA ) 40 MG TABS tablet, TAKE 1 TABLET BY MOUTH DAILY WITH SUPPER., Disp: 90 tablet, Rfl: 0   temazepam  (RESTORIL ) 22.5 MG capsule, Take 1 capsule (22.5 mg total) by mouth at bedtime as needed., Disp: 30 capsule, Rfl: 5   VITAMIN D, ERGOCALCIFEROL, PO, Take by mouth., Disp: , Rfl:    zolpidem  (AMBIEN ) 10 MG tablet, Take 1 tablet (10 mg total) by mouth at bedtime as needed for sleep., Disp: 30 tablet, Rfl: 0   ALPRAZolam  (XANAX ) 0.5 MG tablet, Take 1 tablet (0.5 mg total) by mouth 2 (two) times daily as needed for anxiety., Disp: 60 tablet, Rfl: 0   hydrOXYzine  (ATARAX ) 25 MG tablet, Take 1 tablet (25 mg total) by mouth every 8 (eight) hours as needed., Disp: 270 tablet, Rfl: 1   lamoTRIgine  (LAMICTAL ) 150 MG tablet, Take 1 tablet (150 mg  total) by mouth daily., Disp: 90 tablet, Rfl: 1   oxcarbazepine  (TRILEPTAL ) 600 MG tablet, Take 1 tablet (600 mg total) by mouth 2 (two) times daily., Disp: 180 tablet, Rfl: 1  "

## 2024-02-13 NOTE — Addendum Note (Signed)
 Addended by: [place] Gustafson T on: 02/13/2024 05:48 PM   Modules accepted: Orders, Level of Service

## 2024-05-14 ENCOUNTER — Telehealth: Admitting: Physician Assistant
